# Patient Record
Sex: Female | Born: 1960 | Race: Black or African American | Hispanic: No | Marital: Married | Smoking: Never smoker
Health system: Southern US, Community
[De-identification: ages and names within clinical notes are randomized; demographics above are authoritative.]

## PROBLEM LIST (undated history)

## (undated) DIAGNOSIS — C801 Malignant (primary) neoplasm, unspecified: Secondary | ICD-10-CM

## (undated) DIAGNOSIS — C23 Malignant neoplasm of gallbladder: Secondary | ICD-10-CM

## (undated) DIAGNOSIS — E7800 Pure hypercholesterolemia, unspecified: Secondary | ICD-10-CM

## (undated) DIAGNOSIS — C50919 Malignant neoplasm of unspecified site of unspecified female breast: Secondary | ICD-10-CM

## (undated) HISTORY — PX: BREAST SURGERY: SHX581

## (undated) HISTORY — PX: CHOLECYSTECTOMY: SHX55

---

## 1998-06-10 ENCOUNTER — Other Ambulatory Visit: Admission: RE | Admit: 1998-06-10 | Discharge: 1998-06-10 | Payer: Self-pay | Admitting: Gynecology

## 1999-04-08 ENCOUNTER — Other Ambulatory Visit: Admission: RE | Admit: 1999-04-08 | Discharge: 1999-04-08 | Payer: Self-pay | Admitting: Obstetrics and Gynecology

## 1999-07-01 ENCOUNTER — Inpatient Hospital Stay (HOSPITAL_COMMUNITY): Admission: AD | Admit: 1999-07-01 | Discharge: 1999-07-01 | Payer: Self-pay | Admitting: Obstetrics and Gynecology

## 1999-10-06 ENCOUNTER — Ambulatory Visit (HOSPITAL_COMMUNITY): Admission: RE | Admit: 1999-10-06 | Discharge: 1999-10-06 | Payer: Self-pay | Admitting: Physician Assistant

## 1999-10-09 ENCOUNTER — Ambulatory Visit (HOSPITAL_COMMUNITY): Admission: RE | Admit: 1999-10-09 | Discharge: 1999-10-09 | Payer: Self-pay | Admitting: *Deleted

## 1999-10-09 ENCOUNTER — Encounter: Payer: Self-pay | Admitting: *Deleted

## 2000-01-15 ENCOUNTER — Observation Stay (HOSPITAL_COMMUNITY): Admission: AD | Admit: 2000-01-15 | Discharge: 2000-01-16 | Payer: Self-pay | Admitting: Obstetrics and Gynecology

## 2000-01-16 ENCOUNTER — Encounter: Payer: Self-pay | Admitting: Obstetrics and Gynecology

## 2000-03-11 ENCOUNTER — Encounter: Admission: RE | Admit: 2000-03-11 | Discharge: 2000-06-09 | Payer: Self-pay | Admitting: Obstetrics & Gynecology

## 2000-07-08 ENCOUNTER — Inpatient Hospital Stay (HOSPITAL_COMMUNITY): Admission: AD | Admit: 2000-07-08 | Discharge: 2000-07-11 | Payer: Self-pay | Admitting: Obstetrics & Gynecology

## 2000-07-12 ENCOUNTER — Encounter: Admission: RE | Admit: 2000-07-12 | Discharge: 2000-07-27 | Payer: Self-pay | Admitting: Obstetrics & Gynecology

## 2000-07-27 ENCOUNTER — Other Ambulatory Visit: Admission: RE | Admit: 2000-07-27 | Discharge: 2000-07-27 | Payer: Self-pay | Admitting: Oral Surgery

## 2001-05-13 ENCOUNTER — Encounter: Payer: Self-pay | Admitting: Family Medicine

## 2001-05-13 ENCOUNTER — Ambulatory Visit (HOSPITAL_COMMUNITY): Admission: RE | Admit: 2001-05-13 | Discharge: 2001-05-13 | Payer: Self-pay | Admitting: Family Medicine

## 2001-05-14 ENCOUNTER — Encounter: Payer: Self-pay | Admitting: Family Medicine

## 2001-05-14 ENCOUNTER — Ambulatory Visit (HOSPITAL_COMMUNITY): Admission: RE | Admit: 2001-05-14 | Discharge: 2001-05-14 | Payer: Self-pay | Admitting: Family Medicine

## 2001-09-27 ENCOUNTER — Other Ambulatory Visit: Admission: RE | Admit: 2001-09-27 | Discharge: 2001-09-27 | Payer: Self-pay | Admitting: Obstetrics & Gynecology

## 2003-01-07 ENCOUNTER — Other Ambulatory Visit: Admission: RE | Admit: 2003-01-07 | Discharge: 2003-01-07 | Payer: Self-pay | Admitting: Obstetrics & Gynecology

## 2007-08-07 ENCOUNTER — Emergency Department (HOSPITAL_COMMUNITY): Admission: EM | Admit: 2007-08-07 | Discharge: 2007-08-07 | Payer: Self-pay | Admitting: Emergency Medicine

## 2007-10-12 ENCOUNTER — Encounter: Admission: RE | Admit: 2007-10-12 | Discharge: 2007-10-12 | Payer: Self-pay | Admitting: Obstetrics & Gynecology

## 2011-01-22 NOTE — Discharge Summary (Signed)
[facility]  Patient:    Christina Little, Christina Little                  MRN: [id number] Adm. Date:  [id number] Disc. Date: [id number] Attending:  Genia Del                           Discharge Summary  DATE OF BIRTH:                Sep 27, 2060  ADMISSION DIAGNOSES:          1. [redacted] week gestation.                               2. Two previous cesarean sections.                               3. Gestational diabetes, class AII.  DISCHARGE DIAGNOSES:          1. [redacted] week gestation.                               2. Two previous cesarean section.                               3. Gestational diabetes, class AII.                               4. Birth of a baby boy.                               5. Postoperative anemia, hemoglobin 9.4.  HOSPITAL COURSE:              The patient was admitted on the date of her C-section at 55 weeks.  The reason for the C-section was repeat with two previous cesarean sections.  She had gestational diabetes, class AII, with fair control with insulin, but poorly respected diet.  Her boy was born at 1456 hours with an Apgar of 8 and 9 and weight was 4265 g.  The estimated blood loss was 800 cc and no complications occurred.  Postoperative evolution was unremarkable.  The hemoglobin postoperatively was 9.4 with a hematocrit of 27.8.  She remained afebrile and was discharged on postoperative day #3 on July 11, 2000, with postoperative advice and follow-up appointment plans in four to six weeks.  She was given iron sulfate one tablet daily and Tylox p.r.n. for pain. DD:  08/02/00 TD:  08/02/00 Job: [id number]

## 2011-01-22 NOTE — Op Note (Signed)
[facility]  Patient:    Christina Little, Christina Little                  MRN: [id number] Proc. Date: 07/08/00 Adm. Date:  [id number] Attending:  Genia Del                           Operative Report  DATE OF BIRTH:                June 04, 1961  PREOPERATIVE DIAGNOSES:       Thirty-nine weeks, two previous cesarean sections, gestational diabetes mellitus, class A2.  POSTOPERATIVE DIAGNOSES:      Thirty-nine week, two previous cesarean sections, gestational diabetes mellitus class A2.  SURGEON:                      Reuben Likes, M.D.  ASSISTANT:                    Debbora Dus, N.P.  ANESTHESIOLOGIST:             Ellison Hughs., M.D.  INTERVENTION:                 Repeat low transverse cesarean section under spinal anesthesia.  PROCEDURE:                    Under spinal anesthesia the patient is in 15 degree left decubitus position.  She is prepped with Betadine on the abdominal, suprapubic, vulvar, and vaginal areas.  The bladder catheter is inserted and the patient is draped as usual.  A Pfannenstiel incision is made with a scalpel at the location of the old cesarean section scar.  The aponeurosis is opened transversely with the Mayo scissors.  Dense fibrosis is present at that level.  Then the recti muscles are separated from the aponeurosis on the midline upward and downward.  The peritoneum is then opened longitudinally with the Metzenbaum scissors.  The bladder retractor is inserted.  We then opened the visceroperitoneum over the lower uterine segment transversely with the Metzenbaum scissors.  The bladder is retracted downward. We verified the position of the uterus.  Hysterotomy is done with the scalpel first and then ______ on each side with the dressing scissors.  It is then at the level of the lower uterine segment transversely.  The fetus is in cephalic presentation.  Birth at 65 of a female baby.  The cord is clamped and  cut. The baby was suctioned at the delivery of the head and then resuctioned after the baby was fully out of the uterus.  The amniotic fluid was clear.  The baby is then given to the neonatologists who gives an Apgar of 8 and 9.  Blood is taken from the cord.  Placenta is evacuated spontaneously, complete with three vessels.  Revision of the uterus is done.  We then closed the hysterotomy in the first plane with Vicryl 0 in a locked running suture.  Second plane is then done with a Vicryl 0 in a ______ type suture with then complete hemostasis by ______ sutures with Vicryl 0 at the left corner and in the midline.  Hemostasis is adequate.  The uterus is reinserted in the abdominopelvic cavity.  We verify the paracolic gutters and removed blood clots at those levels.  The post surrogate sac and the pelvis are also irrigated and ______.  We verify the last  time the hysterotomy.  Hemostasis is good.  We complete hemostasis at the level of the recti muscles with electrocautery.  We then reapproximate the recti muscles on the midline with Vicryl 0.  We then close the aponeurosis with two half running sutures of Vicryl 0.  We verify hemostasis at the level of the adipose tissue.  The electrocautery is used to complete hemostasis.  Infiltration of Marcaine 1/4 20 cc in the subcutaneous tissue and then the skin is reapproximated with staples.  A dry dressing is applied.  Count of ______ and instruments was complete x 2.  Estimated blood loss was 800 cc.  No complication occurred. Patient was transferred to the recovery room in good stages.  Blood group A+. Rubella immune. DD:  07/08/00 TD:  07/08/00 Job: [id number]

## 2015-09-02 ENCOUNTER — Encounter: Payer: Self-pay | Admitting: Obstetrics & Gynecology

## 2015-09-15 ENCOUNTER — Other Ambulatory Visit: Payer: Self-pay | Admitting: General Surgery

## 2015-09-15 DIAGNOSIS — C50911 Malignant neoplasm of unspecified site of right female breast: Secondary | ICD-10-CM

## 2015-09-16 ENCOUNTER — Other Ambulatory Visit: Payer: Self-pay | Admitting: General Surgery

## 2015-09-16 DIAGNOSIS — D0511 Intraductal carcinoma in situ of right breast: Secondary | ICD-10-CM

## 2015-09-25 ENCOUNTER — Other Ambulatory Visit: Payer: Self-pay

## 2015-09-26 ENCOUNTER — Ambulatory Visit
Admission: RE | Admit: 2015-09-26 | Discharge: 2015-09-26 | Disposition: A | Discharge status: Home / Self Care | Source: Ambulatory Visit | Attending: General Surgery | Admitting: General Surgery

## 2015-09-26 ENCOUNTER — Encounter: Payer: Self-pay | Admitting: *Deleted

## 2015-09-26 DIAGNOSIS — D0511 Intraductal carcinoma in situ of right breast: Secondary | ICD-10-CM

## 2015-09-26 MED ORDER — GADOBENATE DIMEGLUMINE 529 MG/ML IV SOLN
16.0000 mL | Freq: Once | INTRAVENOUS | Status: AC | PRN
  Administered 2015-09-26: 16 mL via INTRAVENOUS

## 2015-09-30 ENCOUNTER — Other Ambulatory Visit: Payer: Self-pay | Admitting: General Surgery

## 2015-09-30 DIAGNOSIS — R928 Other abnormal and inconclusive findings on diagnostic imaging of breast: Secondary | ICD-10-CM

## 2015-09-30 DIAGNOSIS — N6310 Unspecified lump in the right breast, unspecified quadrant: Secondary | ICD-10-CM

## 2015-09-30 DIAGNOSIS — N6320 Unspecified lump in the left breast, unspecified quadrant: Secondary | ICD-10-CM

## 2015-10-01 ENCOUNTER — Other Ambulatory Visit: Payer: Self-pay | Admitting: General Surgery

## 2015-10-01 DIAGNOSIS — R928 Other abnormal and inconclusive findings on diagnostic imaging of breast: Secondary | ICD-10-CM

## 2015-10-09 ENCOUNTER — Other Ambulatory Visit

## 2015-10-13 ENCOUNTER — Ambulatory Visit
Admission: RE | Admit: 2015-10-13 | Discharge: 2015-10-13 | Disposition: A | Discharge status: Home / Self Care | Source: Ambulatory Visit | Attending: General Surgery | Admitting: General Surgery

## 2015-10-13 ENCOUNTER — Other Ambulatory Visit

## 2015-10-13 ENCOUNTER — Other Ambulatory Visit: Payer: Self-pay | Admitting: General Surgery

## 2015-10-13 DIAGNOSIS — R928 Other abnormal and inconclusive findings on diagnostic imaging of breast: Secondary | ICD-10-CM

## 2015-10-13 MED ORDER — GADOBENATE DIMEGLUMINE 529 MG/ML IV SOLN
15.0000 mL | Freq: Once | INTRAVENOUS | Status: AC | PRN
  Administered 2015-10-13: 15 mL via INTRAVENOUS

## 2015-10-15 ENCOUNTER — Other Ambulatory Visit: Payer: Self-pay | Admitting: General Surgery

## 2015-10-15 DIAGNOSIS — R921 Mammographic calcification found on diagnostic imaging of breast: Secondary | ICD-10-CM

## 2015-10-21 ENCOUNTER — Telehealth: Payer: Self-pay | Admitting: Genetic Counselor

## 2015-10-21 ENCOUNTER — Other Ambulatory Visit: Payer: Self-pay | Admitting: General Surgery

## 2015-10-21 ENCOUNTER — Ambulatory Visit
Admission: RE | Admit: 2015-10-21 | Discharge: 2015-10-21 | Disposition: A | Discharge status: Home / Self Care | Source: Ambulatory Visit | Attending: General Surgery | Admitting: General Surgery

## 2015-10-21 DIAGNOSIS — R921 Mammographic calcification found on diagnostic imaging of breast: Secondary | ICD-10-CM

## 2015-10-21 NOTE — Telephone Encounter (Signed)
PT CONFIRMED GENETIC COUNSELING APPT FOR 11/06/15 MAILED OUT NEW PT PACKET

## 2015-10-31 ENCOUNTER — Other Ambulatory Visit: Payer: Self-pay | Admitting: General Surgery

## 2015-10-31 DIAGNOSIS — C50111 Malignant neoplasm of central portion of right female breast: Secondary | ICD-10-CM

## 2015-10-31 DIAGNOSIS — C50112 Malignant neoplasm of central portion of left female breast: Principal | ICD-10-CM

## 2015-11-06 ENCOUNTER — Other Ambulatory Visit

## 2015-11-06 ENCOUNTER — Ambulatory Visit (HOSPITAL_BASED_OUTPATIENT_CLINIC_OR_DEPARTMENT_OTHER): Admitting: Genetic Counselor

## 2015-11-06 ENCOUNTER — Encounter: Payer: Self-pay | Admitting: Genetic Counselor

## 2015-11-06 DIAGNOSIS — Z315 Encounter for genetic counseling: Secondary | ICD-10-CM

## 2015-11-06 DIAGNOSIS — C50911 Malignant neoplasm of unspecified site of right female breast: Secondary | ICD-10-CM

## 2015-11-06 DIAGNOSIS — Z8509 Personal history of malignant neoplasm of other digestive organs: Secondary | ICD-10-CM | POA: Diagnosis not present

## 2015-11-06 DIAGNOSIS — C50912 Malignant neoplasm of unspecified site of left female breast: Secondary | ICD-10-CM

## 2015-11-06 DIAGNOSIS — Z8042 Family history of malignant neoplasm of prostate: Secondary | ICD-10-CM

## 2015-11-06 DIAGNOSIS — Z803 Family history of malignant neoplasm of breast: Secondary | ICD-10-CM

## 2015-11-06 DIAGNOSIS — Z809 Family history of malignant neoplasm, unspecified: Secondary | ICD-10-CM

## 2015-11-06 DIAGNOSIS — Z800 Family history of malignant neoplasm of digestive organs: Secondary | ICD-10-CM

## 2015-11-06 NOTE — Progress Notes (Addendum)
REFERRING PROVIDER: HOXWORTH, BENJAMIN, MD  PRIMARY PROVIDER:  No primary care provider on file.  PRIMARY REASON FOR VISIT:  1. Bilateral malignant neoplasm of breast in female, unspecified site of breast (HCC)   2. History of cancer of gall bladder   3. Family history of breast cancer in female   4. Family history of prostate cancer   5. Family history of colon cancer   6. Family history of cancer      HISTORY OF PRESENT ILLNESS:   [name], a 54 y.o. female, was seen for a [place] cancer genetics consultation at the request of Dr. Hoxworth due to a personal history bilateral breast cancer and gall bladder cancer and family history of early-onset breast cancer, prostate cancer, and other cancers.  Christina Little presents to clinic today to discuss the possibility of a hereditary predisposition to cancer, genetic testing, and to further clarify her future cancer risks, as well as potential cancer risks for family members.   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 This was treated with surgery.  In February 2017 at the age of 54, Christina Little was diagnosed with bilateral breast cancers.  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  Christina Little's surgery is pending set-up with [facility] (Dr. Hoxworth is not in her insurance network) and pending genetic test results.  CANCER HISTORY:   No history exists.     HORMONAL RISK FACTORS:  Menarche was at age 9.  First live birth at age 23.  OCP use for approximately 2 years.  Ovaries intact: yes.  Hysterectomy: no.  Menopausal status: postmenopausal.  HRT use: 0 years. Colonoscopy: Yes; most recent colonoscopy within approx past two years; 02/2009 colonoscopy  ([facility]) - benign polyp found in left colon. Mammogram within the last year: yes. Number of breast biopsies: 2 (none prior to breast cancer diagnoses) Up to date with pelvic exams:  Yes. 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 No pertinent past medical history.  History reviewed. No pertinent past surgical history.  Social History   Social History  . Marital Status: Unknown    Spouse Name: N/A  . Number of Children: N/A  . Years of Education: N/A   Social History Main Topics  . Smoking status: Never Smoker   . Smokeless tobacco: Never Used  . Alcohol Use: No  . Drug Use: None  . Sexual Activity: Not Asked   Other Topics Concern  . None   Social History Narrative  . None     FAMILY HISTORY:  We obtained a detailed, 4-generation family history.  Significant diagnoses are listed below: Family History  Problem Relation Age of Onset  . Diabetes Mother   . Varicose Veins Father   . Breast cancer Paternal Aunt     dx. 40s or younger  . Diabetes Maternal Grandmother   . Heart attack Maternal Grandfather   . Breast cancer Maternal Aunt     dx. 70s  . Cancer Cousin     maternal 1st cousin; NOS cancer; dx. late 40s  . Sickle cell anemia Cousin     maternal 1st cousin died of SCD, another (cousin's brother) also has SCD  .   Cancer Other     maternal great aunt with NOS cancer; dx. late 60s-70s  . Prostate cancer Other     maternal great uncle with prostate cancer  . Breast cancer Paternal Aunt     dx. late 40s or earlier  . Breast cancer Cousin     paternal 1st cousin, dx. late 60s-early 70s    Christina Little has three sons--two sons, ages 31 and 28 are from a prior relationship.  Her youngest son is 13.  Her son who is 28 has one daughter and a son on the way, but, unfortunately, Christina Little does not get to see her granddaughter much currently.  Christina Little's  mother is currently 81 and has never had cancer.  Christina Little's father passed away in his late 70s but never had cancer.  Christina Little has one paternal half-sister and two paternal half-brothers, but has no information for these siblings.    Christina Little's mother had two full sisters and three full brothers.  Many of the maternal aunts and uncles passed away in their 70s without any history of cancer (mostly diabetes-related).  One maternal aunt died of breast cancer in her 70s.  This aunt has three daughters and two sons; one daughter died of sickle cell.  One maternal first cousin died of an uncertain type of cancer in her late 40s.  Christina Little's maternal grandmother died following diabetic coma in her late 40s-early 50s.  She had five sisters, one of whom was diagnosed with an unspecified type of cancer in her late 60s-70s, and one brother who had a history of prostate cancer.  Christina Little's maternal grandfather died of a heart attack at the age of 85.  Christina Little had no further information for other maternal great aunts/uncles and great grandparents.    Christina Little had limited information for several paternal relatives.  Both paternal aunts had a history of breast cancer in their 40s or earlier and passed away at younger ages due to accidental causes.  One of these aunts had at least one daughter who died of breast cancer in her late 60s-early 70s.  Christina Little reports that all three of her paternal uncles have passed away, but had no further information regarding their causes/ages at death or regarding their cancer history.  Christina Little never met her paternal grandparents and has no further information for any other paternal relatives.  Patient's maternal ancestors are of Chinese/Native American/African American descent, and paternal ancestors are of West Indian/African American descent. There is no reported Ashkenazi Jewish ancestry. There is no known  consanguinity.  GENETIC COUNSELING ASSESSMENT: Acelynn Y Lorge is a 54 y.o. female with a personal and family history of cancer which is somewhat suggestive of a hereditary breast cancer syndrome and predisposition to cancer. We, therefore, discussed and recommended the following at today's visit.   DISCUSSION: We reviewed the characteristics, features and inheritance patterns of hereditary cancer syndromes, particularly those caused by mutations within the BRCA1/2 genes. We also discussed genetic testing, including the appropriate family members to test, the process of testing, insurance coverage and turn-around-time for results. We discussed the implications of a negative, positive and/or variant of uncertain significant result. We recommended Ms. Reeg pursue genetic testing for the 20-gene Breast/Ovarian Cancer Panel through [facility].  The Breast/Ovarian Cancer Panel offered by [facility] ([place], [place]) includes sequencing and deletion/duplication analysis for the following 19 genes:  ATM, BARD1, BRCA1, BRCA2, BRIP1, CDH1, CHEK2, FANCC, MLH1, MSH2, MSH6, NBN, PALB2, PMS2,   PTEN, RAD51C, RAD51D, TP53, and XRCC2.  This panel also includes deletion/duplication analysis (without sequencing) for one gene, EPCAM.  Based on Ms. Dupler's personal and family history of cancer, she meets medical criteria for genetic testing. Despite that she meets criteria, she may still have an out of pocket cost. We discussed that if her out of pocket cost for testing is over $100, the laboratory will call and confirm whether she wants to proceed with testing.  If the out of pocket cost of testing is less than $100 she will be billed by the genetic testing laboratory.    PLAN: After considering the risks, benefits, and limitations, Ms. Martos  provided informed consent to pursue genetic testing and the blood sample was sent to [facility] for analysis of the 20-gene  Breast/Ovarian Cancer Panel. Results are generally available within approximately 2-3 weeks' time, however, since Ms. Nesser will utilize this information to help inform her surgical/treatment decisions, we will ask that [facility] return these results STAT.  Thus, we should be able to expect these results back closer to the 2-week time frame, at which point they will be disclosed by telephone to Ms. Steinberger, as will any additional recommendations warranted by these results. Ms. Montellano will receive a summary of her genetic counseling visit and a copy of her results once available. This information will also be available in Epic. We encouraged Ms. Tiano to remain in contact with cancer genetics annually so that we can continuously update the family history and inform her of any changes in cancer genetics and testing that may be of benefit for her family. Ms. Esch's questions were answered to her satisfaction today. Our contact information was provided should additional questions or concerns arise.  Thank you for the referral and allowing us to share in the care of your patient.   Kayla Boggs, MS Genetic Counselor [email or web address] Phone: [phone]  The patient was seen for a total of 60 minutes in face-to-face genetic counseling.  This patient was discussed with Drs. Magrinat, Gudena and/or Feng who agrees with the above.    _______________________________________________________________________ For Office Staff:  Number of people involved in session: 2 Was an Intern/ student involved with case: no    

## 2015-11-18 ENCOUNTER — Telehealth: Payer: Self-pay | Admitting: Genetic Counselor

## 2015-11-19 ENCOUNTER — Ambulatory Visit: Payer: Self-pay | Admitting: Genetic Counselor

## 2015-11-19 DIAGNOSIS — Z8509 Personal history of malignant neoplasm of other digestive organs: Secondary | ICD-10-CM

## 2015-11-19 DIAGNOSIS — C50911 Malignant neoplasm of unspecified site of right female breast: Secondary | ICD-10-CM

## 2015-11-19 DIAGNOSIS — C50912 Malignant neoplasm of unspecified site of left female breast: Secondary | ICD-10-CM

## 2015-11-19 DIAGNOSIS — Z809 Family history of malignant neoplasm, unspecified: Secondary | ICD-10-CM

## 2015-11-19 DIAGNOSIS — Z1379 Encounter for other screening for genetic and chromosomal anomalies: Secondary | ICD-10-CM

## 2015-11-19 DIAGNOSIS — Z803 Family history of malignant neoplasm of breast: Secondary | ICD-10-CM

## 2015-11-21 NOTE — Telephone Encounter (Signed)
Discussed with Ms. Haynes-Burby that her genetic test results were negative for pathogenic mutations within any of 20 genes that would increase her risk for breast, ovarian, or other related cancers.  Additionally, no uncertain changes were found.  Discussed that, while this result cannot totally rule out a genetic cause for the personal or family history of cancer, that most likely this is a reassuring result for [place].  She should continue to follow her doctor's recommendations for cancer screening in the future.  Encouraged her to call us if anyone else in the family is diagnosed with cancer and to call us in the future if she would like to find out about any updated genetic testing options.  She will have her surgery in [place] on April 3rd.  She is happy to receive this news and she knows she is welcome to call or email with any questions.  I will also email her a copy of her negative genetic test result to share with her doctors in [place].

## 2015-12-04 DIAGNOSIS — Z1379 Encounter for other screening for genetic and chromosomal anomalies: Secondary | ICD-10-CM | POA: Insufficient documentation

## 2015-12-04 NOTE — Progress Notes (Signed)
GENETIC TEST RESULT  HPI: Ms. State was previously seen in the [place] clinic due to a personal history of bilateral breast cancers and adenocarcinoma of the gall bladder, family history of early-onset breast and other cancers, and concerns regarding a hereditary predisposition to cancer. Please refer to our prior cancer genetics clinic note from November 06, 2015 for more information regarding Ms. Haynes-Burby's medical, social and family histories, and our assessment and recommendations, at the time. Ms. Call recent genetic test results were disclosed to her, as were recommendations warranted by these results. These results and recommendations are discussed in more detail below.  GENETIC TEST RESULTS: At the time of Ms. Haynes-Burby's visit on November 06, 2015, we recommended she pursue genetic testing of the 20-gene Breast/Ovarian Cancer Panel through [facility].  The Breast/Ovarian Cancer Panel offered by [facility] [place], [place]) includes sequencing and deletion/duplication analysis for the following 19 genes:  ATM, BARD1, BRCA1, BRCA2, BRIP1, CDH1, CHEK2, FANCC, MLH1, MSH2, MSH6, NBN, PALB2, PMS2, PTEN, RAD51C, RAD51D, TP53, and XRCC2.  This panel also includes deletion/duplication analysis (without sequencing) for one gene, EPCAM.  Those results are now back, the report date for which is November 18, 2015.  Genetic testing was normal, and did not reveal a deleterious mutation in these genes.  Additionally, no variants of uncertain significance (VUSes) were found.  The test report will be scanned into EPIC and will be located under the Results Review tab in the Pathology>Molecular Pathology section.   We discussed with Ms. Haynes-Burby that since the current genetic testing is not perfect, it is possible there may be a gene mutation in one of these genes that current testing cannot detect, but that chance is small. We also discussed, that it is possible  that another gene that has not yet been discovered, or that we have not yet tested, is responsible for the cancer diagnoses in the family, and it is, therefore, important to remain in touch with cancer genetics in the future so that we can continue to offer Ms. Haynes-Burby the most up-to-date genetic testing.   CANCER SCREENING RECOMMENDATIONS: While we still do not have an explanation for the personal and family history of cancer, this result may be reassuring and indicate that Ms. Haynes-Burby likely does not have an increased risk for a future cancer due to a mutation in one of these genes. This normal test also suggests that Ms. Haynes-Burby's cancer was most likely not due to an inherited predisposition associated with one of these genes.  Most cancers happen by chance and this negative test suggests that her cancer falls into this category.  We, therefore, recommended she continue to follow the cancer management and screening guidelines provided by her oncology and primary healthcare providers.  It may also be helpful for further elucidation of Ms. Haynes-Burby's own cancer risks to offer further genetic testing to her paternal relatives, however Ms. Haynes-Burby has limited information/contact with many of these relatives.  RECOMMENDATIONS FOR FAMILY MEMBERS: Women in this family might be at some increased risk of developing cancer, over the general population risk, simply due to the family history of cancer. We recommended women in this family have a yearly mammogram beginning at age 79, or 54 years younger than the earliest onset of cancer, an an annual clinical breast exam, and perform monthly breast self-exams. Women in this family should also have a gynecological exam as recommended by their primary provider. All family members should have a colonoscopy by age 75.  FOLLOW-UP: Lastly, we discussed with Ms. Haynes-Burby that cancer genetics is a rapidly advancing field and it is possible that new  genetic tests will be appropriate for her and/or her family members in the future. We encouraged her to remain in contact with cancer genetics on an annual basis so we can update her personal and family histories and let her know of advances in cancer genetics that may benefit this family.   Our contact number was provided. Ms. Routson questions were answered to her satisfaction, and she knows she is welcome to call us at anytime with additional questions or concerns.   Jeanine Luz, MS, [facility] Certified Genetic Counselor [email or web address] Phone: [phone]

## 2016-12-24 ENCOUNTER — Encounter (HOSPITAL_BASED_OUTPATIENT_CLINIC_OR_DEPARTMENT_OTHER): Payer: Self-pay | Admitting: *Deleted

## 2016-12-24 DIAGNOSIS — Z853 Personal history of malignant neoplasm of breast: Secondary | ICD-10-CM | POA: Insufficient documentation

## 2016-12-24 DIAGNOSIS — Y9389 Activity, other specified: Secondary | ICD-10-CM | POA: Insufficient documentation

## 2016-12-24 DIAGNOSIS — S61215A Laceration without foreign body of left ring finger without damage to nail, initial encounter: Secondary | ICD-10-CM | POA: Diagnosis not present

## 2016-12-24 DIAGNOSIS — S6992XA Unspecified injury of left wrist, hand and finger(s), initial encounter: Secondary | ICD-10-CM | POA: Diagnosis present

## 2016-12-24 DIAGNOSIS — S66323A Laceration of extensor muscle, fascia and tendon of left middle finger at wrist and hand level, initial encounter: Secondary | ICD-10-CM | POA: Diagnosis not present

## 2016-12-24 DIAGNOSIS — W260XXA Contact with knife, initial encounter: Secondary | ICD-10-CM | POA: Diagnosis not present

## 2016-12-24 DIAGNOSIS — Y929 Unspecified place or not applicable: Secondary | ICD-10-CM | POA: Diagnosis not present

## 2016-12-24 DIAGNOSIS — Y999 Unspecified external cause status: Secondary | ICD-10-CM | POA: Diagnosis not present

## 2016-12-24 NOTE — ED Triage Notes (Signed)
Laceration to her left 3rd and 4th digits with a knife.

## 2016-12-25 ENCOUNTER — Emergency Department (HOSPITAL_BASED_OUTPATIENT_CLINIC_OR_DEPARTMENT_OTHER)
Admission: EM | Admit: 2016-12-25 | Discharge: 2016-12-25 | Disposition: A | Discharge status: Home / Self Care | Attending: Emergency Medicine | Admitting: Emergency Medicine

## 2016-12-25 ENCOUNTER — Emergency Department (HOSPITAL_BASED_OUTPATIENT_CLINIC_OR_DEPARTMENT_OTHER)

## 2016-12-25 DIAGNOSIS — S61213A Laceration without foreign body of left middle finger without damage to nail, initial encounter: Secondary | ICD-10-CM

## 2016-12-25 DIAGNOSIS — S61215A Laceration without foreign body of left ring finger without damage to nail, initial encounter: Secondary | ICD-10-CM

## 2016-12-25 HISTORY — DX: Malignant (primary) neoplasm, unspecified: C80.1

## 2016-12-25 HISTORY — DX: Malignant neoplasm of gallbladder: C23

## 2016-12-25 HISTORY — DX: Malignant neoplasm of unspecified site of unspecified female breast: C50.919

## 2016-12-25 HISTORY — DX: Pure hypercholesterolemia, unspecified: E78.00

## 2016-12-25 MED ORDER — HYDROCODONE-ACETAMINOPHEN 5-325 MG PO TABS: 1.0000 | ORAL_TABLET | ORAL | 0 refills | Status: DC | PRN

## 2016-12-25 MED ORDER — CEFAZOLIN IN D5W 1 GM/50ML IV SOLN
1.0000 g | Freq: Once | INTRAVENOUS | Status: AC
  Administered 2016-12-25: 1 g via INTRAVENOUS
  Filled 2016-12-25: qty 50

## 2016-12-25 MED ORDER — CEPHALEXIN 500 MG PO CAPS: 500.0000 mg | ORAL_CAPSULE | Freq: Four times a day (QID) | ORAL | 0 refills | Status: DC

## 2016-12-25 MED ORDER — LIDOCAINE HCL 2 % IJ SOLN
20.0000 mL | Freq: Once | INTRAMUSCULAR | Status: AC
  Administered 2016-12-25: 400 mg via INTRADERMAL
  Filled 2016-12-25: qty 20

## 2016-12-25 NOTE — ED Notes (Signed)
EDP at bedside suturing  

## 2016-12-25 NOTE — ED Notes (Signed)
Pt has 1 cm laceration to middle and ring finger on left hand.  Pt is unable to bend distal joint from laceration on left hand.

## 2016-12-25 NOTE — ED Provider Notes (Signed)
[facility] DEPT [facility] Provider Note: Georgena Spurling, MD, FACEP  CSN: [id number] MRN: [id number] ARRIVAL: 12/24/16 at 2218 ROOM: [place]  [facility]   HISTORY OF PRESENT ILLNESS  Christina Little is a 56 y.o. female was slashing the tires of her car yesterday evening about 10 PM to prevent her son from borrowing it. While doing this she cut her left hand. She has lacerations of the volar side of the left third and fourth fingers; the lacerations are transverse and just distal to the PIP joints. Sensation is intact but she is unable to flex at the DIP joint of the left third finger. She states her tetanus is up-to-date. She denies significant pain at the present time.   Past Medical History:  Diagnosis Date  . Breast cancer ([place])   . Cancer ([place])   . 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 High cholesterol     Past Surgical History:  Procedure Laterality Date  . BREAST SURGERY    . CESAREAN SECTION    . CHOLECYSTECTOMY      Family History  Problem Relation Age of Onset  . Diabetes Mother   . Varicose Veins Father   . Breast cancer Paternal Aunt     dx. 1s or younger  . Diabetes Maternal Grandmother   . Heart attack Maternal Grandfather   . Breast cancer Maternal Aunt     dx. 66s  . Cancer Cousin     maternal 1st cousin; NOS cancer; dx. late 60s  . Sickle cell anemia Cousin     maternal 1st cousin died of SCD, another (cousin's brother) also has SCD  . Cancer Other     maternal great aunt with NOS cancer; dx. late 60s-70s  . Prostate cancer Other     maternal great uncle with prostate cancer  . Breast cancer Paternal Aunt     dx. late 62s or earlier  . Breast cancer Cousin     paternal 1st cousin, dx. late 60s-early 44s    Social History  Substance Use Topics  . Smoking status: Never Smoker  . Smokeless tobacco: Never Used  . Alcohol use No    Prior to Admission medications   Not on File    Allergies Shellfish allergy   REVIEW OF  SYSTEMS  Negative except as noted here or in the History of Present Illness.   PHYSICAL EXAMINATION  Initial Vital Signs Blood pressure (!) 123/94, pulse 91, temperature 98.2 F (36.8 C), temperature source Oral, resp. rate 20, height 5' (1.524 m), weight 159 lb (72.1 kg), SpO2 98 %.  Examination General: Well-developed, well-nourished female in no acute distress; appearance consistent with age of record HENT: normocephalic; atraumatic Eyes: pupils equal, round and reactive to light; extraocular muscles intact Neck: supple Heart: regular rate and rhythm Lungs: clear to auscultation bilaterally Abdomen: soft; nondistended Extremities: No deformity; full range of motion; pulses normal; lacerations of the volar side of the left third and fourth fingers just distal to the PIP joints; affected fingers are distally neurovascularly intact; the patient is unable to flex the DIP joint of the left third finger; flexion at the PIP joints of the left third and fourth fingers is intact Neurologic: Awake, alert and oriented; motor function intact in all extremities and symmetric; no facial droop Skin: Warm and dry Psychiatric: Normal mood and affect   RESULTS  Summary of this visit's results, reviewed by myself:   EKG Interpretation  Date/Time:    Ventricular Rate:  PR Interval:    QRS Duration:   QT Interval:    QTC Calculation:   R Axis:     Text Interpretation:        Laboratory Studies: No results found for this or any previous visit (from the past 24 hour(s)). Imaging Studies: Dg Hand Complete Left  Result Date: 12/25/2016 CLINICAL DATA:  Cut fourth digit, bleeding to the PIP joint EXAM: LEFT HAND - COMPLETE 3+ VIEW COMPARISON:  None. FINDINGS: No fracture or malalignment. Punctate opacities likely represent skin artifact. No definite radiopaque foreign body. IMPRESSION: No acute osseous abnormality Electronically Signed   By: Donavan Foil M.D.   On: 12/25/2016 00:57    ED  COURSE  Nursing notes and initial vitals signs, including pulse oximetry, reviewed.  Vitals:   12/24/16 2236 12/24/16 2243 12/25/16 0149  BP:  (!) 123/94 120/76  Pulse:  91 87  Resp:  20 18  Temp:  98.2 F (36.8 C)   TempSrc:  Oral   SpO2:  98% 99%  Weight: 159 lb (72.1 kg)    Height: 5' (1.524 m)      PROCEDURES   LACERATION REPAIR Performed by: Wynetta Fines Authorized by: Wynetta Fines Consent: Verbal consent obtained. Risks and benefits: risks, benefits and alternatives were discussed Consent given by: patient Patient identity confirmed: provided demographic data Prepped and Draped in normal sterile fashion Wound explored, tendon deficit noted  Laceration Location: Left third finger  Laceration Length: 2 cm  No Foreign Bodies seen or palpated  Anesthesia: local infiltration  Local anesthetic: lidocaine 2 % without epinephrine  Anesthetic total: 3 ml  Irrigation method: syringe Amount of cleaning: standard  Skin closure: 4-0 Prolene   Number of sutures: 7   Technique: The bladder opted   Patient tolerance: Patient tolerated the procedure well with no immediate complications.   LACERATION REPAIR Performed by: Wynetta Fines Authorized by: Wynetta Fines Consent: Verbal consent obtained. Risks and benefits: risks, benefits and alternatives were discussed Consent given by: patient Patient identity confirmed: provided demographic data Prepped and Draped in normal sterile fashion Wound explored  Laceration Location: Left fourth finger  Laceration Length: One cm  No Foreign Bodies seen or palpated  Anesthesia: local infiltration  Local anesthetic: lidocaine 2 % without epinephrine  Anesthetic total: 1.5 ml  Irrigation method: syringe Amount of cleaning: standard  Skin closure: 4-0 Prolene   Number of sutures: 3   Technique: Simple interrupted   Patient tolerance: Patient tolerated the procedure well with no immediate  complications.      ED DIAGNOSES     ICD-9-CM ICD-10-CM   1. Laceration of left middle finger with tendon involvement 883.2 S61.213A     S66.922A   2. Laceration of left ring finger without foreign body without damage to nail, initial encounter 883.0 W38.882C        Shanon Rosser, MD 12/25/16 [phone]

## 2016-12-25 NOTE — ED Notes (Signed)
Pt tolerated well. PMS intact before and after. All questions answered.

## 2016-12-25 NOTE — ED Notes (Signed)
Patient transported to X-ray 

## 2016-12-25 NOTE — ED Notes (Signed)
Pt verbalizes understanding of d/c instructions and denies any further needs at this time. 

## 2017-02-23 IMAGING — MR MR LT BREAST BX W LOC DEV 1ST LESION IMAGE BX SPEC MR GUIDE
8 of 11 series · 34 of 48 positions shown · IV contrast (15ml Multihance)
Comparison: Previous exams.

CLINICAL DATA: 54-year-old female for MRI guided biopsy of an
irregular left breast mass

EXAM:
MRI GUIDED CORE NEEDLE BIOPSY OF THE LEFT BREAST
TECHNIQUE: Multiplanar, multisequence MR imaging of the left breast was
performed both before and after administration of intravenous
contrast.
CONTRAST:  15 cc MultiHance

[Series 2: axial pre-cm · axial · non-contrast · 1.3mm · 0.78mm/px · z∈[-101,+85]mm · 5 of 144 slices shown]
[im 1/144]
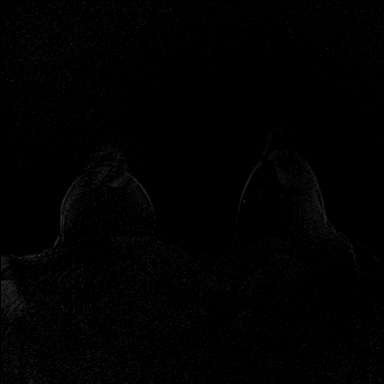
[im 36/144]
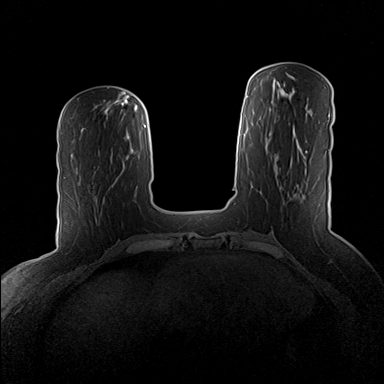
[im 72/144]
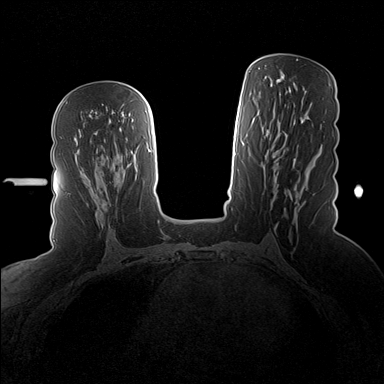
[im 108/144]
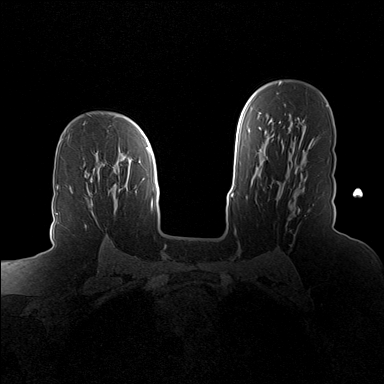
[im 144/144]
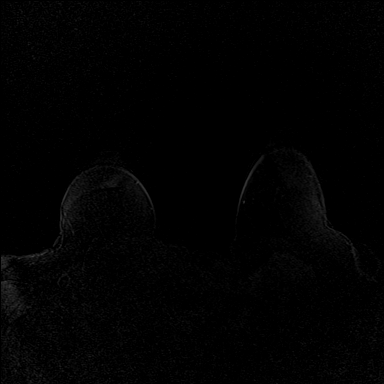

[Series 4: axial post 20 · axial · 1.3mm · 0.78mm/px · z∈[-101,+85]mm · 5 of 144 slices shown (1 of 2)]
[im 1/144]
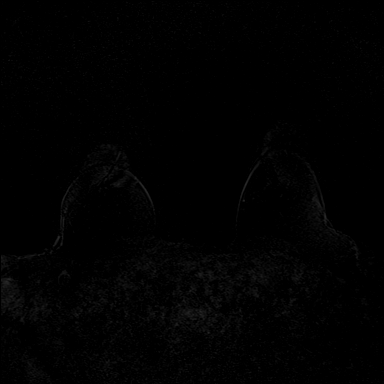
[im 36/144]
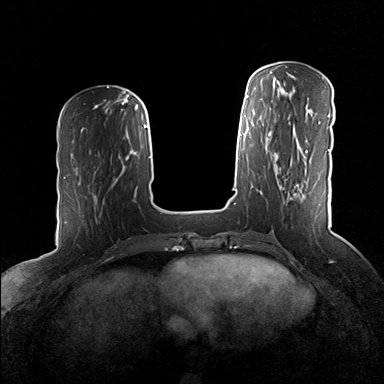
[im 72/144]
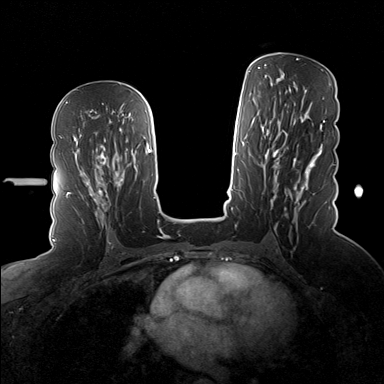
[im 108/144]
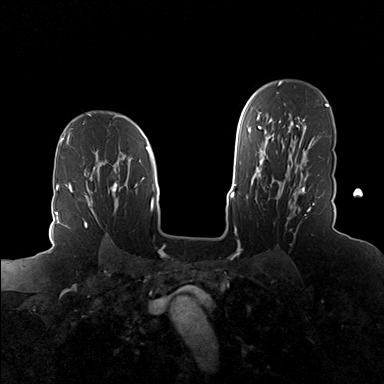
[im 144/144]
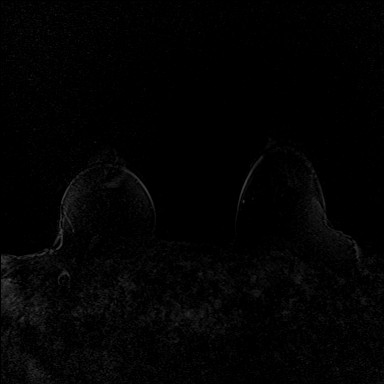

[Series 5: axial post 20 · axial · 1.3mm · 0.78mm/px · z∈[-101,+85]mm · 5 of 144 slices shown (2 of 2)]
[im 1/144]
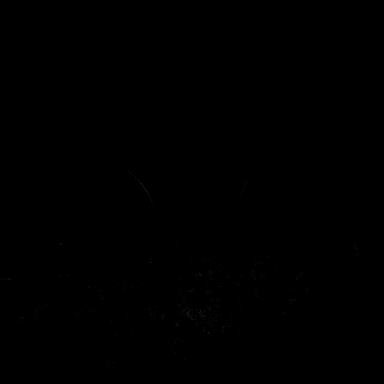
[im 36/144]
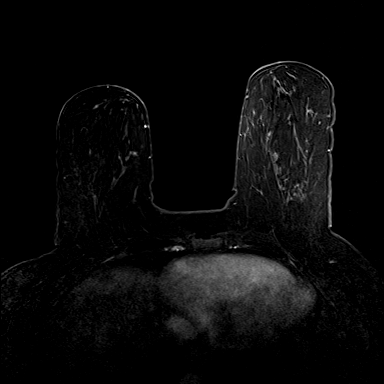
[im 72/144]
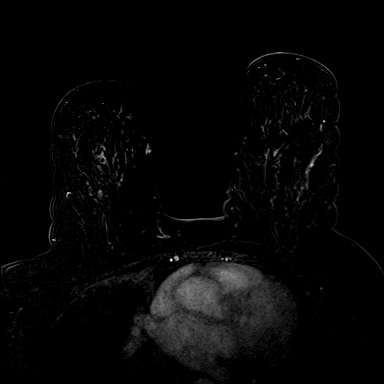
[im 108/144]
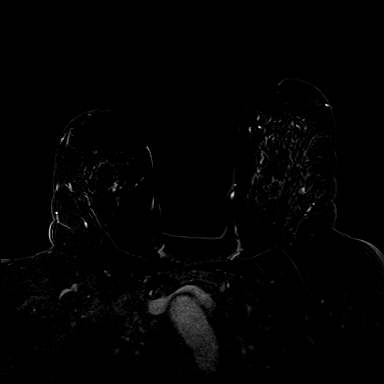
[im 144/144]
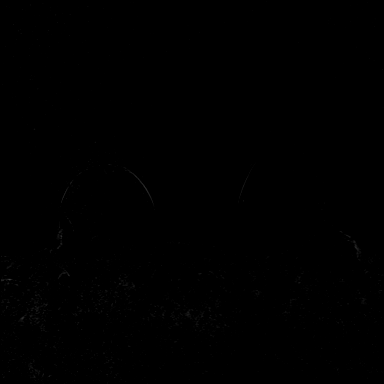

[Series 7: axial confirmation · axial · 1.3mm · 0.78mm/px · z∈[-101,+85]mm · 5 of 144 slices shown (1 of 2)]
[im 1/144]
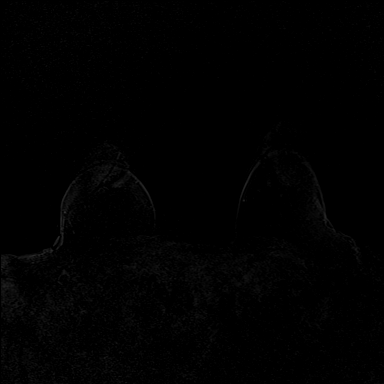
[im 36/144]
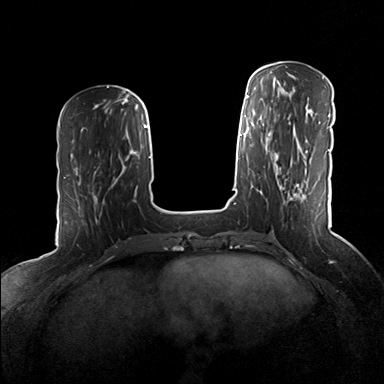
[im 72/144]
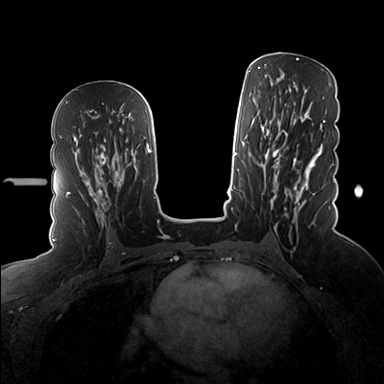
[im 108/144]
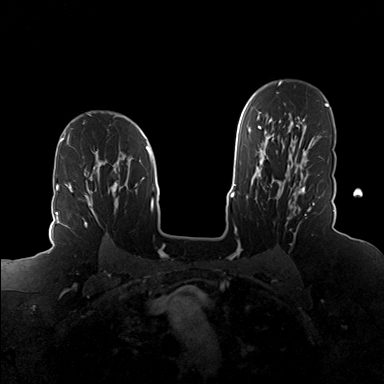
[im 144/144]
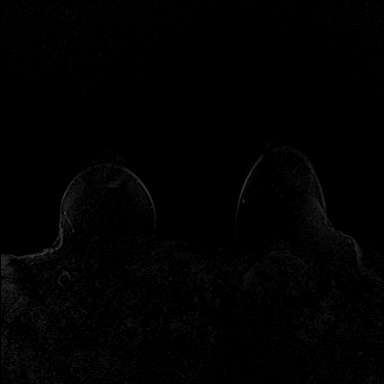

[Series 8: axial confirmation_sub · axial · 1.3mm · 0.78mm/px · z∈[-101,+85]mm · 4 of 144 slices shown (1 of 2)]
[im 1/144]
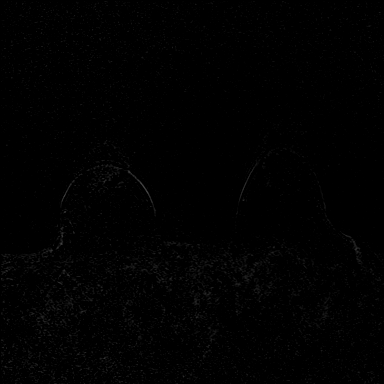
[im 48/144]
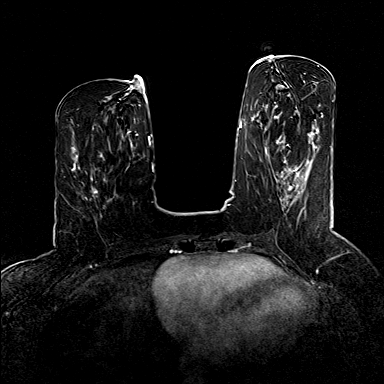
[im 96/144]
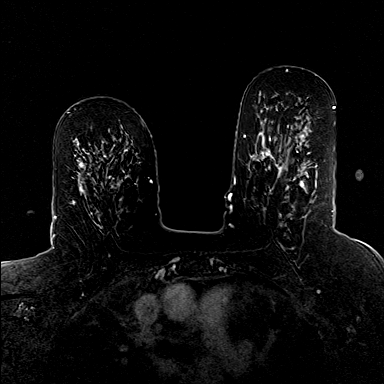
[im 144/144]
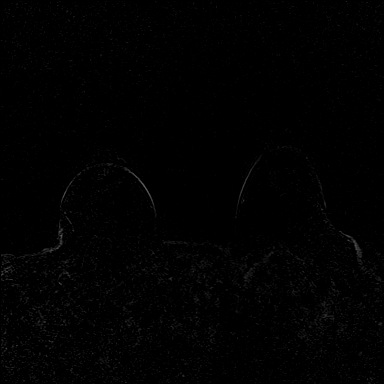

[Series 10: axial confirmation · axial · 1.3mm · 0.78mm/px · z∈[-101,+85]mm · 4 of 144 slices shown (2 of 2)]
[im 1/144]
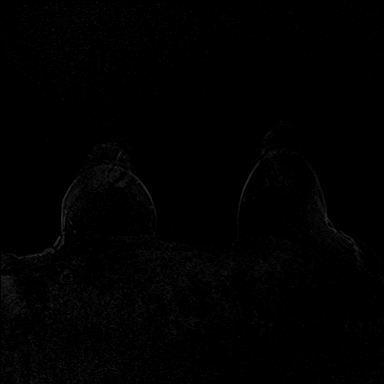
[im 48/144]
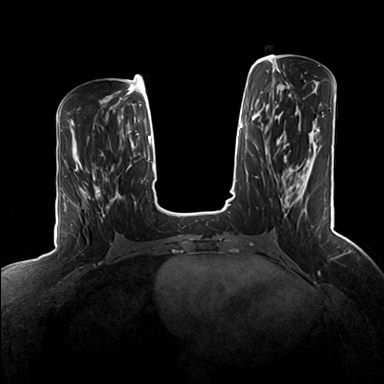
[im 96/144]
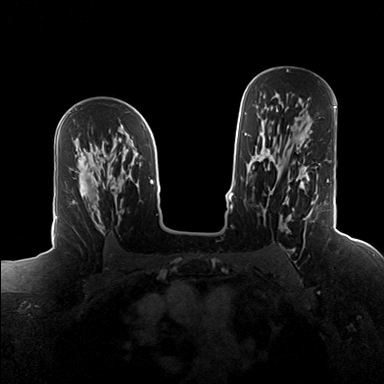
[im 144/144]
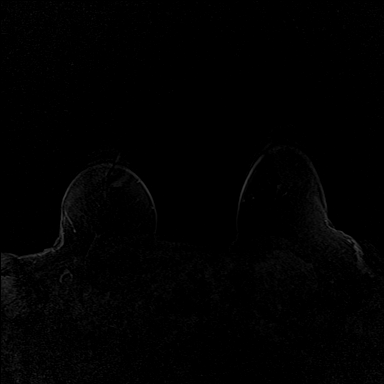

[Series 11: axial confirmation_sub · axial · 1.3mm · 0.78mm/px · z∈[-101,+85]mm · 4 of 144 slices shown (2 of 2)]
[im 1/144]
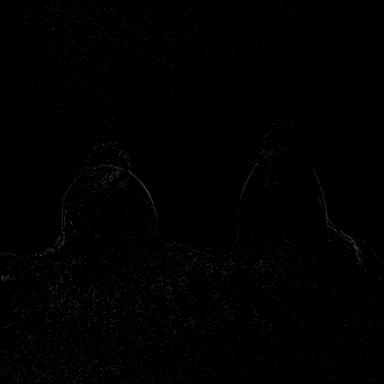
[im 48/144]
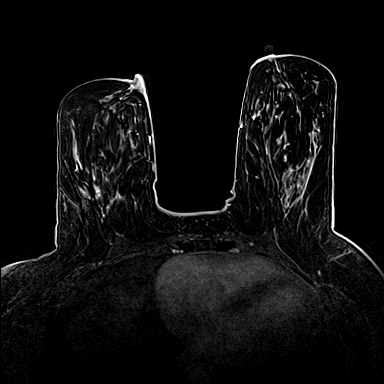
[im 96/144]
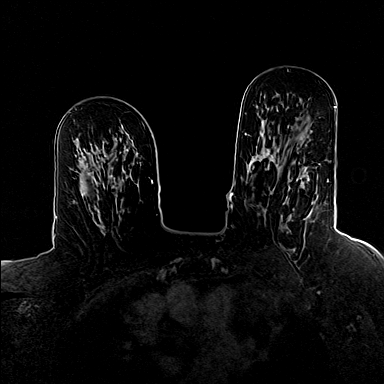
[im 144/144]
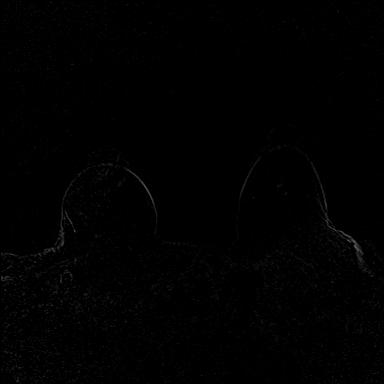

[Series 13: axial post clip · axial · 1.3mm · 0.78mm/px · z∈[-101,-40]mm · 2 of 144 slices shown]
[im 1/144]
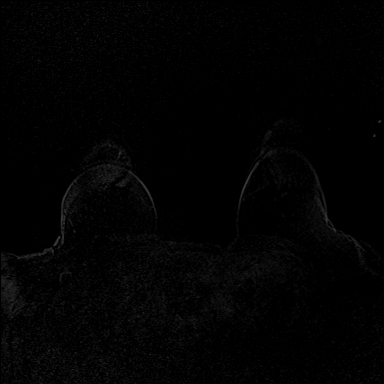
[im 48/144]
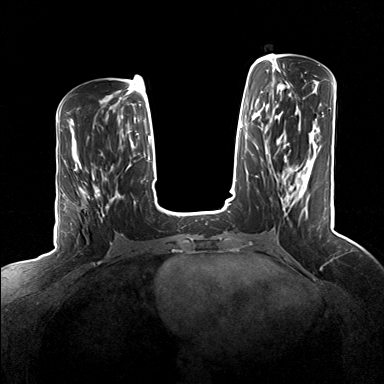

[34 of 48 positions shown; findings below may reference images not displayed]

FINDINGS: I met with the patient, and we discussed the procedure of MRI guided
biopsy, including risks, benefits, and alternatives. Specifically,
we discussed the risks of infection, bleeding, tissue injury, clip
migration, and inadequate sampling. Informed, written consent was
given. The usual time out protocol was performed immediately prior
to the procedure.

Using sterile technique, 1% Lidocaine, MRI guidance, and a 9 gauge
vacuum assisted device, biopsy was performed of an irregular mass
within the upper, inner left breast using a lateral to medial
approach. At the conclusion of the procedure, a cylinder-shaped
tissue marker clip was deployed into the biopsy cavity. Follow-up
2-view mammogram was performed and dictated separately.
IMPRESSION: MRI guided biopsy of the left breast. No apparent complications.

## 2018-06-09 ENCOUNTER — Encounter: Payer: Self-pay | Admitting: Obstetrics & Gynecology

## 2018-06-16 ENCOUNTER — Ambulatory Visit (INDEPENDENT_AMBULATORY_CARE_PROVIDER_SITE_OTHER): Admitting: Obstetrics & Gynecology

## 2018-06-16 ENCOUNTER — Encounter: Payer: Self-pay | Admitting: Obstetrics & Gynecology

## 2018-06-16 VITALS — BP 122/76 | Ht 60.5 in | Wt 165.0 lb

## 2018-06-16 DIAGNOSIS — E6609 Other obesity due to excess calories: Secondary | ICD-10-CM | POA: Diagnosis not present

## 2018-06-16 DIAGNOSIS — Z780 Asymptomatic menopausal state: Secondary | ICD-10-CM | POA: Diagnosis not present

## 2018-06-16 DIAGNOSIS — Z01419 Encounter for gynecological examination (general) (routine) without abnormal findings: Secondary | ICD-10-CM | POA: Diagnosis not present

## 2018-06-16 DIAGNOSIS — Z23 Encounter for immunization: Secondary | ICD-10-CM

## 2018-06-16 DIAGNOSIS — C50912 Malignant neoplasm of unspecified site of left female breast: Secondary | ICD-10-CM

## 2018-06-16 DIAGNOSIS — C50911 Malignant neoplasm of unspecified site of right female breast: Secondary | ICD-10-CM | POA: Diagnosis not present

## 2018-06-16 DIAGNOSIS — Z6831 Body mass index (BMI) 31.0-31.9, adult: Secondary | ICD-10-CM

## 2018-06-16 DIAGNOSIS — Z1151 Encounter for screening for human papillomavirus (HPV): Secondary | ICD-10-CM

## 2018-06-16 DIAGNOSIS — E66811 Obesity, class 1: Secondary | ICD-10-CM

## 2018-06-16 DIAGNOSIS — Z8619 Personal history of other infectious and parasitic diseases: Secondary | ICD-10-CM

## 2018-06-16 MED ORDER — VALACYCLOVIR HCL 1 G PO TABS: 1000.0000 mg | ORAL_TABLET | Freq: Two times a day (BID) | ORAL | 1 refills | Status: AC

## 2018-06-16 NOTE — Addendum Note (Signed)
Addended by: Thurnell Garbe A on: 06/16/2018 09:10 AM   Modules accepted: Orders

## 2018-06-16 NOTE — Progress Notes (Signed)
Christina Little 31-Jan-1961 [id number]   History:    57 y.o. G5P3A2L3 Widowed.  Youngest son is 20 yo (I delivered him)  RP:  Established patient presenting for annual gyn exam   HPI: Menopause, well without hormone replacement therapy.  No postmenopausal bleeding.  No pelvic pain.  Abstinent for 3 to 4 years.  Her husband passed away last year.  Bilateral breast cancer status post bilateral mastectomy, followed by oncology.  Urine and bowel movements normal.  Body mass index 31.69.  Would like to lose weight.  Walking regularly.  Under investigation for increased liver enzymes, abdominal ultrasound scheduled today.  Colonoscopy in 2018.  Bone density scheduled today.  Past medical history,surgical history, family history and social history were all reviewed and documented in the EPIC chart.  Gynecologic History No LMP recorded. Patient is postmenopausal. Contraception: abstinence and post menopausal status Last Pap: 2018. Results were: normal Last mammogram: 2017 S/P Bilateral Mastectomy Bone Density: Scheduled today Colonoscopy: 2018  Obstetric History OB History  Gravida Para Term Preterm AB Living  5 3     2 3   SAB TAB Ectopic Multiple Live Births  2            # Outcome Date GA Lbr Len/2nd Weight Sex Delivery Anes PTL Lv  5 SAB           4 SAB           3 Para           2 Para           1 Para              ROS: A ROS was performed and pertinent positives and negatives are included in the history.  GENERAL: No fevers or chills. HEENT: No change in vision, no earache, sore throat or sinus congestion. NECK: No pain or stiffness. CARDIOVASCULAR: No chest pain or pressure. No palpitations. PULMONARY: No shortness of breath, cough or wheeze. GASTROINTESTINAL: No abdominal pain, nausea, vomiting or diarrhea, melena or bright red blood per rectum. GENITOURINARY: No urinary frequency, urgency, hesitancy or dysuria. MUSCULOSKELETAL: No joint or muscle pain, no back pain, no  recent trauma. DERMATOLOGIC: No rash, no itching, no lesions. ENDOCRINE: No polyuria, polydipsia, no heat or cold intolerance. No recent change in weight. HEMATOLOGICAL: No anemia or easy bruising or bleeding. NEUROLOGIC: No headache, seizures, numbness, tingling or weakness. PSYCHIATRIC: No depression, no loss of interest in normal activity or change in sleep pattern.     Exam:   BP 122/76   Ht 5' 0.5" (1.537 m)   Wt 165 lb (74.8 kg)   BMI 31.69 kg/m   Body mass index is 31.69 kg/m.  General appearance : Well developed well nourished female. No acute distress HEENT: Eyes: no retinal hemorrhage or exudates,  Neck supple, trachea midline, no carotid bruits, no thyroidmegaly Lungs: Clear to auscultation, no rhonchi or wheezes, or rib retractions  Heart: Regular rate and rhythm, no murmurs or gallops Breast:Examined in sitting and supine position were symmetrical in appearance, no palpable masses or tenderness,  no skin retraction, no nipple inversion, no nipple discharge, no skin discoloration, no axillary or supraclavicular lymphadenopathy Abdomen: no palpable masses or tenderness, no rebound or guarding Extremities: no edema or skin discoloration or tenderness  Pelvic: Vulva: Normal             Vagina: No gross lesions or discharge  Cervix: No gross lesions or discharge.  Pap/HPV HR done  Uterus  AV, normal size, shape and consistency, non-tender and mobile  Adnexa  Without masses or tenderness  Anus: Normal   Assessment/Plan:  57 y.o. female for annual exam   1. Encounter for routine gynecological examination with Papanicolaou smear of cervix Normal gynecologic exam in menopause.  Pap with high-risk HPV done.  Status post bilateral mastectomy.  Abdominal ultrasound today because of increased liver enzymes.  2. Postmenopausal Well on no hormone replacement therapy.  No postmenopausal bleeding.  Bone density scheduled today.  Recommend vitamin D supplements, calcium intake of 1.5  g/day and regular weightbearing physical activity.  3. Bilateral malignant neoplasm of breast in female, unspecified estrogen receptor status, unspecified site of breast [facility]) Status post bilateral mastectomy.  Followed by oncologist.  4. Class 1 obesity due to excess calories without serious comorbidity with body mass index (BMI) of 31.0 to 31.9 in adult Recommend low calorie/low carb diet such as [employer].  I aerobic physical activity 5 times a week and weightlifting every 2 days.  5. Hx of herpes genitalis No episode in the last 15 years.  Would like to have a prescription in case of a recurrence.  Valaciclovir prescription sent to pharmacy.  Other orders - valACYclovir (VALTREX) 1000 MG tablet; Take 1 tablet (1,000 mg total) by mouth 2 (two) times daily for 5 days.  Princess Bruins MD, 8:17 AM 06/16/2018

## 2018-06-16 NOTE — Patient Instructions (Signed)
1. Encounter for routine gynecological examination with Papanicolaou smear of cervix Normal gynecologic exam in menopause.  Pap with high-risk HPV done.  Status post bilateral mastectomy.  Abdominal ultrasound today because of increased liver enzymes.  2. Postmenopausal Well on no hormone replacement therapy.  No postmenopausal bleeding.  Bone density scheduled today.  Recommend vitamin D supplements, calcium intake of 1.5 g/day and regular weightbearing physical activity.  3. Bilateral malignant neoplasm of breast in female, unspecified estrogen receptor status, unspecified site of breast [facility]) Status post bilateral mastectomy.  Followed by oncologist.  4. Class 1 obesity due to excess calories without serious comorbidity with body mass index (BMI) of 31.0 to 31.9 in adult Recommend low calorie/low carb diet such as [employer].  I aerobic physical activity 5 times a week and weightlifting every 2 days.  5. Hx of herpes genitalis No episode in the last 15 years.  Would like to have a prescription in case of a recurrence.  Valaciclovir prescription sent to pharmacy.  Other orders - valACYclovir (VALTREX) 1000 MG tablet; Take 1 tablet (1,000 mg total) by mouth 2 (two) times daily for 5 days.  Mariavictoria, it was a pleasure seeing you today!  I will inform you of your results as soon as they are available.

## 2018-06-20 LAB — PAP, TP IMAGING W/ HPV RNA, RFLX HPV TYPE 16,18/45: HPV DNA HIGH RISK: NOT DETECTED

## 2019-06-28 ENCOUNTER — Other Ambulatory Visit: Payer: Self-pay

## 2019-06-29 ENCOUNTER — Ambulatory Visit (INDEPENDENT_AMBULATORY_CARE_PROVIDER_SITE_OTHER): Admitting: Obstetrics & Gynecology

## 2019-06-29 ENCOUNTER — Encounter: Payer: Self-pay | Admitting: Obstetrics & Gynecology

## 2019-06-29 VITALS — BP 120/70 | Ht 61.25 in | Wt 160.0 lb

## 2019-06-29 DIAGNOSIS — Z23 Encounter for immunization: Secondary | ICD-10-CM | POA: Diagnosis not present

## 2019-06-29 DIAGNOSIS — Z01419 Encounter for gynecological examination (general) (routine) without abnormal findings: Secondary | ICD-10-CM

## 2019-06-29 DIAGNOSIS — C50911 Malignant neoplasm of unspecified site of right female breast: Secondary | ICD-10-CM

## 2019-06-29 DIAGNOSIS — Z780 Asymptomatic menopausal state: Secondary | ICD-10-CM

## 2019-06-29 DIAGNOSIS — Z13820 Encounter for screening for osteoporosis: Secondary | ICD-10-CM

## 2019-06-29 DIAGNOSIS — Z853 Personal history of malignant neoplasm of breast: Secondary | ICD-10-CM | POA: Diagnosis not present

## 2019-06-29 NOTE — Progress Notes (Signed)
EVOLETH DUBEAU 1961-05-15 [id number]   History:    58 y.o. [id number] Widowed.  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  No postmenopausal bleeding.  No pelvic pain.  Abstinent for 5 years.  Bilateral breast cancer status post bilateral mastectomy, followed by oncology.  Urine and bowel movements normal. Body mass index 29.99.  Walking regularly.  Colonoscopy in 2018.  Health labs with Fam MD.   Past medical history,surgical history, family history and social history were all reviewed and documented in the EPIC chart.  Gynecologic History No LMP recorded. Patient is postmenopausal. Contraception: abstinence and post menopausal status Last Pap: 06/2018. Results were: Negative/HPV HR neg Last mammogram: S/P Bilateral Mastectomy with reconstruction Bone Density: Schedule now Colonoscopy: 2018  Obstetric History OB History  Gravida Para Term Preterm AB Living  5 3     2 3   SAB TAB Ectopic Multiple Live Births  2            # Outcome Date GA Lbr Len/2nd Weight Sex Delivery Anes PTL Lv  5 SAB           4 SAB           3 Para           2 Para           1 Para              ROS: A ROS was performed and pertinent positives and negatives are included in the history.  GENERAL: No fevers or chills. HEENT: No change in vision, no earache, sore throat or sinus congestion. NECK: No pain or stiffness. CARDIOVASCULAR: No chest pain or pressure. No palpitations. PULMONARY: No shortness of breath, cough or wheeze. GASTROINTESTINAL: No abdominal pain, nausea, vomiting or diarrhea, melena or bright red blood per rectum. GENITOURINARY: No urinary frequency, urgency, hesitancy or dysuria. MUSCULOSKELETAL: No joint or muscle pain, no back pain, no recent trauma. DERMATOLOGIC: No rash, no itching, no lesions. ENDOCRINE: No polyuria, polydipsia, no heat or cold intolerance. No recent  change in weight. HEMATOLOGICAL: No anemia or easy bruising or bleeding. NEUROLOGIC: No headache, seizures, numbness, tingling or weakness. PSYCHIATRIC: No depression, no loss of interest in normal activity or change in sleep pattern.     Exam:   BP 120/70   Ht 5' 1.25" (1.556 m)   Wt 160 lb (72.6 kg)   BMI 29.99 kg/m   Body mass index is 29.99 kg/m.  General appearance : Well developed well nourished female. No acute distress HEENT: Eyes: no retinal hemorrhage or exudates,  Neck supple, trachea midline, no carotid bruits, no thyroidmegaly Lungs: Clear to auscultation, no rhonchi or wheezes, or rib retractions  Heart: Regular rate and rhythm, no murmurs or gallops Breast:Examined in sitting and supine position were symmetrical in appearance, no palpable masses or tenderness,  no skin retraction, no nipple inversion, no nipple discharge, no skin discoloration, no axillary or supraclavicular lymphadenopathy Abdomen: no palpable masses or tenderness, no rebound or guarding Extremities: no edema or skin discoloration or tenderness  Pelvic: Vulva: Normal             Vagina: No gross lesions or discharge  Cervix: No gross lesions or discharge  Uterus  AV, normal size, shape and consistency, non-tender and mobile  Adnexa  Without masses or tenderness  Anus: Normal   Assessment/Plan:  58 y.o. female  for annual exam   1. Well female exam with routine gynecological exam Normal gynecologic exam in menopause.  No indication to repeat the Pap test this year, Pap test was negative with negative high-risk HPV October 2019.  Status post bilateral mastectomies with reconstruction for bilateral breast cancer.  Colonoscopy 2018.  Health labs with family physician.  2. Postmenopausal Well on no hormone replacement therapy.  No postmenopausal bleeding.    3. Screening for osteoporosis We will schedule a bone density here now.  Vitamin D supplements, calcium intake of 1200 mg daily and regular  weightbearing physical activities.  4. Bilateral malignant neoplasm of breast in female, unspecified estrogen receptor status, unspecified site of breast [facility]) Status post bilateral mastectomies and reconstruction.  5. Flu vaccine need Flu shot given.  Princess Bruins MD, 8:32 AM 06/29/2019

## 2019-06-29 NOTE — Patient Instructions (Signed)
1. Well female exam with routine gynecological exam Normal gynecologic exam in menopause.  No indication to repeat the Pap test this year, Pap test was negative with negative high-risk HPV October 2019.  Status post bilateral mastectomies with reconstruction for bilateral breast cancer.  Colonoscopy 2018.  Health labs with family physician.  2. Postmenopausal Well on no hormone replacement therapy.  No postmenopausal bleeding.    3. Screening for osteoporosis We will schedule a bone density here now.  Vitamin D supplements, calcium intake of 1200 mg daily and regular weightbearing physical activities.  4. Bilateral malignant neoplasm of breast in female, unspecified estrogen receptor status, unspecified site of breast [facility]) Status post bilateral mastectomies and reconstruction.  5. Flu vaccine need Flu shot given.  Christina Little, it was a pleasure seeing you today!

## 2019-06-29 NOTE — Addendum Note (Signed)
Addended by: Thurnell Garbe A on: 06/29/2019 09:08 AM   Modules accepted: Orders

## 2019-07-18 ENCOUNTER — Other Ambulatory Visit: Payer: Self-pay

## 2019-07-19 ENCOUNTER — Ambulatory Visit: Admitting: Obstetrics & Gynecology

## 2019-07-19 ENCOUNTER — Other Ambulatory Visit: Payer: Self-pay | Admitting: Obstetrics & Gynecology

## 2019-07-19 ENCOUNTER — Other Ambulatory Visit: Payer: Self-pay

## 2019-07-19 ENCOUNTER — Ambulatory Visit (INDEPENDENT_AMBULATORY_CARE_PROVIDER_SITE_OTHER)

## 2019-07-19 ENCOUNTER — Other Ambulatory Visit

## 2019-07-19 DIAGNOSIS — Z780 Asymptomatic menopausal state: Secondary | ICD-10-CM

## 2019-07-19 DIAGNOSIS — Z13820 Encounter for screening for osteoporosis: Secondary | ICD-10-CM

## 2019-09-17 ENCOUNTER — Other Ambulatory Visit: Payer: Self-pay

## 2019-09-18 ENCOUNTER — Encounter: Payer: Self-pay | Admitting: Obstetrics & Gynecology

## 2019-09-18 ENCOUNTER — Ambulatory Visit (INDEPENDENT_AMBULATORY_CARE_PROVIDER_SITE_OTHER): Admitting: Obstetrics & Gynecology

## 2019-09-18 VITALS — BP 136/90

## 2019-09-18 DIAGNOSIS — C50911 Malignant neoplasm of unspecified site of right female breast: Secondary | ICD-10-CM

## 2019-09-18 DIAGNOSIS — R102 Pelvic and perineal pain: Secondary | ICD-10-CM | POA: Diagnosis not present

## 2019-09-18 DIAGNOSIS — Z853 Personal history of malignant neoplasm of breast: Secondary | ICD-10-CM | POA: Diagnosis not present

## 2019-09-18 DIAGNOSIS — C50912 Malignant neoplasm of unspecified site of left female breast: Secondary | ICD-10-CM

## 2019-09-18 DIAGNOSIS — R140 Abdominal distension (gaseous): Secondary | ICD-10-CM | POA: Diagnosis not present

## 2019-09-18 NOTE — Progress Notes (Signed)
    Christina Little Jun 22, 1961 [id number]        58 y.o.  [id number] Widowed  RP: Bloated abdomen and pelvic pressure x 1 1/2 month  HPI: Bloated with constipation and pelvic pressure x 1 1/2 month.  H/O Bilateral Breast Ca post Bilateral Mastectomy.  Abstinent x >5 yrs.  Postmenopause with no PMB.  Urine normal.   OB History  Gravida Para Term Preterm AB Living  5 3     2 3   SAB TAB Ectopic Multiple Live Births  2            # Outcome Date GA Lbr Len/2nd Weight Sex Delivery Anes PTL Lv  5 SAB           4 SAB           3 Para           2 Para           1 Para             Past medical history,surgical history, problem list, medications, allergies, family history and social history were all reviewed and documented in the EPIC chart.   Directed ROS with pertinent positives and negatives documented in the history of present illness/assessment and plan.  Exam:  Vitals:   09/18/19 1114  BP: 136/90   General appearance:  Normal  Abdomen: Soft, not distended.  No mass felt.  Gynecologic exam: vulva normal.  Uterus increased in volume, nodular, mobile, NT.  No adnexal mass, NT.   Assessment/Plan:  59 y.o. [id number]   1. Bloated abdomen Oncologist scheduling a CT scan of the Thorax and Abdomen. - US Transvaginal Non-OB; Future  2. Pelvic pain in female F/U Pelvic US to evaluate the Ovaries and f/u on the Uterine Fibroids. - US Transvaginal Non-OB; Future  3. Bilateral malignant neoplasm of breast in female, unspecified estrogen receptor status, unspecified site of breast ([place]) S/P Bilateral Mastectomy.  Other orders - metFORMIN (GLUCOPHAGE) 500 MG tablet; Take by mouth 2 (two) times daily with a meal.  Counseling on above issues and coordination of care more than 50% for 25 minutes.  Princess Bruins MD, 11:45 AM 09/18/2019

## 2019-09-21 ENCOUNTER — Encounter: Payer: Self-pay | Admitting: Obstetrics & Gynecology

## 2019-09-21 NOTE — Patient Instructions (Addendum)
1. Bloated abdomen Constipation probably responsible for the bloated symptoms.  Counseling done.  Oncologist scheduling a CT scan of the Thorax and Abdomen. - US Transvaginal Non-OB; Future  2. Pelvic pain in female F/U Pelvic US to evaluate the Ovaries and f/u on the Uterine Fibroids. - US Transvaginal Non-OB; Future  3. Bilateral malignant neoplasm of breast in female, unspecified estrogen receptor status, unspecified site of breast ([place]) S/P Bilateral Mastectomy.  Other orders - metFORMIN (GLUCOPHAGE) 500 MG tablet; Take by mouth 2 (two) times daily with a meal.  Delsy, it was a pleasure seeing you today!

## 2019-10-09 ENCOUNTER — Ambulatory Visit: Admitting: Obstetrics & Gynecology

## 2019-10-09 ENCOUNTER — Other Ambulatory Visit

## 2019-11-26 ENCOUNTER — Other Ambulatory Visit: Payer: Self-pay

## 2019-11-26 DIAGNOSIS — R102 Pelvic and perineal pain: Secondary | ICD-10-CM

## 2019-12-18 ENCOUNTER — Encounter: Payer: Self-pay | Admitting: Obstetrics & Gynecology

## 2019-12-18 ENCOUNTER — Ambulatory Visit (INDEPENDENT_AMBULATORY_CARE_PROVIDER_SITE_OTHER)

## 2019-12-18 ENCOUNTER — Ambulatory Visit (INDEPENDENT_AMBULATORY_CARE_PROVIDER_SITE_OTHER): Admitting: Obstetrics & Gynecology

## 2019-12-18 ENCOUNTER — Other Ambulatory Visit: Payer: Self-pay | Admitting: Obstetrics & Gynecology

## 2019-12-18 ENCOUNTER — Other Ambulatory Visit: Payer: Self-pay

## 2019-12-18 DIAGNOSIS — R1909 Other intra-abdominal and pelvic swelling, mass and lump: Secondary | ICD-10-CM | POA: Diagnosis not present

## 2019-12-18 DIAGNOSIS — Z853 Personal history of malignant neoplasm of breast: Secondary | ICD-10-CM

## 2019-12-18 DIAGNOSIS — N838 Other noninflammatory disorders of ovary, fallopian tube and broad ligament: Secondary | ICD-10-CM | POA: Diagnosis not present

## 2019-12-18 DIAGNOSIS — R102 Pelvic and perineal pain unspecified side: Secondary | ICD-10-CM

## 2019-12-18 DIAGNOSIS — C50911 Malignant neoplasm of unspecified site of right female breast: Secondary | ICD-10-CM

## 2019-12-18 DIAGNOSIS — C50912 Malignant neoplasm of unspecified site of left female breast: Secondary | ICD-10-CM

## 2019-12-18 DIAGNOSIS — Z8509 Personal history of malignant neoplasm of other digestive organs: Secondary | ICD-10-CM

## 2019-12-18 NOTE — Progress Notes (Signed)
    Christina Little 08-06-1961 [id number]        59 y.o.  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  Started feeling bloated with lower abdominal discomfort in about 08/2019, seen in 09/2019 and had a CT Scan of the Thorax/Abdomen/Pelvis was negative for metastatic disease.   OB History  Gravida Para Term Preterm AB Living  5 3     2 3   SAB TAB Ectopic Multiple Live Births  2            # Outcome Date GA Lbr Len/2nd Weight Sex Delivery Anes PTL Lv  5 SAB           4 SAB           3 Para           2 Para           1 Para             Past medical history,surgical history, problem list, medications, allergies, family history and social history were all reviewed and documented in the EPIC chart.   Directed ROS with pertinent positives and negatives documented in the history of present illness/assessment and plan.  Exam:  There were no vitals filed for this visit. General appearance:  Normal  Pelvic US today: Both transabdominal and transvaginal techniques were necessary to evaluate the anatomy.  Anteverted uterus, retroflexed at the C-section scar with no myometrial mass.  The overall size of the uterus is measured at 9.73 x 4.95 x 4.09 cm.  The endometrial lining is thin and symmetrical measured at 2.45 mm with no mass or thickening seen.  Right ovary is small with atrophic appearance.  Left ovary with a 2.1 x 1.8 cm solid mass with central vascularization noted.  Very small amount of free fluid in the posterior cul-de-sac.   Assessment/Plan:  59 y.o. [id number]   1. Ovarian mass, left Pelvic US findings thoroughly reviewed with patient.  Solid left ovarian mass with central vascularization measuring 2.1 x 1.8 cm and presence of a very small amount of free fluid in the posterior cul-de-sac.  Given that the mass is solid, with central vascularization, in the  context of Bilateral Breast Ca and GB Ca, suspicious of Ovarian metastatic disease.  Refer to Oncologist Dr Lonie Peak Chinnasami for further investigation and treatment.   If not metastatic cancer to the Ovary, will refer to Gyneco-Oncology.  - CA 125 - CEA  2. Pelvic pain in female As above.  3. Bilateral malignant neoplasm of breast in female, unspecified estrogen receptor status, unspecified site of breast ([place]) Followed by Oncology, Dr Lonie Peak Chinnasami.  4. History of cancer of gall bladder Followed by Oncology, Dr Lonie Peak Chinnasami.  Princess Bruins MD, 11:20 AM 12/18/2019

## 2019-12-18 NOTE — Patient Instructions (Signed)
1. Ovarian mass, left Pelvic US findings thoroughly reviewed with patient.  Solid left ovarian mass with central vascularization measuring 2.1 x 1.8 cm and presence of a very small amount of free fluid in the posterior cul-de-sac.  Given that the mass is solid, with central vascularization, in the context of Bilateral Breast Ca and GB Ca, suspicious of Ovarian metastatic disease.  Refer to Oncologist Dr Lonie Peak Chinnasami for further investigation and treatment.   If not metastatic cancer to the Ovary, will refer to Gyneco-Oncology.  - CA 125 - CEA  2. Pelvic pain in female As above.  3. Bilateral malignant neoplasm of breast in female, unspecified estrogen receptor status, unspecified site of breast ([place]) Followed by Oncology, Dr Lonie Peak Chinnasami.  4. History of cancer of gall bladder Followed by Oncology, Dr Lonie Peak Chinnasami.  Christina Little, it was a pleasure seeing you today!  I will inform you of your results as soon as they are available.

## 2019-12-19 ENCOUNTER — Telehealth: Payer: Self-pay | Admitting: *Deleted

## 2019-12-19 LAB — CEA: CEA: 2 ng/mL

## 2019-12-19 LAB — CA 125: CA 125: 17 U/mL (ref <35)

## 2019-12-19 NOTE — Telephone Encounter (Signed)
I called Dr.Chinnasami office to call me regarding this.

## 2019-12-19 NOTE — Telephone Encounter (Signed)
-----   Message from Princess Bruins, MD sent at 12/18/2019 11:29 AM EDT ----- Regarding: Contact Hemato-Onco Dr Lonie Peak Chinnasami Left solid ovarian lesion with positive Blood Flow highly suspicious of metastatic ovarian lesion in the context of h/o Breast Ca and GB Ca.  Please contact Dr Wendee Beavers, patient's oncologist for urgent investigation.

## 2019-12-19 NOTE — Telephone Encounter (Addendum)
Called back to [place] office and was informed that patient is schedule on 12/28/19 @ 11:30am with Dr.Micheal Claiborne Billings in [facility]. I called patient and confirmed she is aware and she was.

## 2020-07-04 ENCOUNTER — Encounter: Admitting: Obstetrics & Gynecology
